# Patient Record
Sex: Female | Born: 1990 | Race: Black or African American | Hispanic: No | Marital: Single | Smoking: Never smoker
Health system: Southern US, Community
[De-identification: ages and names within clinical notes are randomized; demographics above are authoritative.]

## PROBLEM LIST (undated history)

## (undated) HISTORY — PX: MOUTH SURGERY: SHX715

---

## 2015-09-07 ENCOUNTER — Emergency Department (HOSPITAL_COMMUNITY): Payer: 59

## 2015-09-07 ENCOUNTER — Encounter (HOSPITAL_COMMUNITY): Payer: Self-pay

## 2015-09-07 ENCOUNTER — Emergency Department (HOSPITAL_COMMUNITY)
Admission: EM | Admit: 2015-09-07 | Discharge: 2015-09-07 | Disposition: A | Discharge status: Home / Self Care | Payer: 59 | Attending: Emergency Medicine | Admitting: Emergency Medicine

## 2015-09-07 DIAGNOSIS — Z3202 Encounter for pregnancy test, result negative: Secondary | ICD-10-CM | POA: Diagnosis not present

## 2015-09-07 DIAGNOSIS — R0981 Nasal congestion: Secondary | ICD-10-CM | POA: Insufficient documentation

## 2015-09-07 DIAGNOSIS — R05 Cough: Secondary | ICD-10-CM | POA: Diagnosis not present

## 2015-09-07 DIAGNOSIS — R1084 Generalized abdominal pain: Secondary | ICD-10-CM

## 2015-09-07 DIAGNOSIS — K920 Hematemesis: Secondary | ICD-10-CM | POA: Insufficient documentation

## 2015-09-07 DIAGNOSIS — Z862 Personal history of diseases of the blood and blood-forming organs and certain disorders involving the immune mechanism: Secondary | ICD-10-CM | POA: Diagnosis not present

## 2015-09-07 DIAGNOSIS — R1110 Vomiting, unspecified: Secondary | ICD-10-CM | POA: Diagnosis present

## 2015-09-07 DIAGNOSIS — J029 Acute pharyngitis, unspecified: Secondary | ICD-10-CM | POA: Insufficient documentation

## 2015-09-07 LAB — POC URINE PREG, ED: PREG TEST UR: NEGATIVE

## 2015-09-07 LAB — URINALYSIS, ROUTINE W REFLEX MICROSCOPIC
BILIRUBIN URINE: NEGATIVE
Glucose, UA: NEGATIVE mg/dL
Ketones, ur: NEGATIVE mg/dL
Leukocytes, UA: NEGATIVE
Nitrite: NEGATIVE
PROTEIN: NEGATIVE mg/dL
Specific Gravity, Urine: 1.027 (ref 1.005–1.030)
UROBILINOGEN UA: 0.2 mg/dL (ref 0.0–1.0)
pH: 6 (ref 5.0–8.0)

## 2015-09-07 LAB — CBC WITH DIFFERENTIAL/PLATELET
Basophils Absolute: 0 10*3/uL (ref 0.0–0.1)
Basophils Relative: 0 %
EOS PCT: 4 %
Eosinophils Absolute: 0.2 10*3/uL (ref 0.0–0.7)
HCT: 33.2 % — ABNORMAL LOW (ref 36.0–46.0)
Hemoglobin: 10.6 g/dL — ABNORMAL LOW (ref 12.0–15.0)
LYMPHS ABS: 2.6 10*3/uL (ref 0.7–4.0)
LYMPHS PCT: 47 %
MCH: 29.2 pg (ref 26.0–34.0)
MCHC: 31.9 g/dL (ref 30.0–36.0)
MCV: 91.5 fL (ref 78.0–100.0)
MONO ABS: 0.5 10*3/uL (ref 0.1–1.0)
MONOS PCT: 9 %
Neutro Abs: 2.2 10*3/uL (ref 1.7–7.7)
Neutrophils Relative %: 40 %
PLATELETS: 230 10*3/uL (ref 150–400)
RBC: 3.63 MIL/uL — AB (ref 3.87–5.11)
RDW: 14.6 % (ref 11.5–15.5)
WBC: 5.5 10*3/uL (ref 4.0–10.5)

## 2015-09-07 LAB — URINE MICROSCOPIC-ADD ON

## 2015-09-07 LAB — LIPASE, BLOOD: Lipase: 21 U/L (ref 11–51)

## 2015-09-07 LAB — COMPREHENSIVE METABOLIC PANEL
ALT: 13 U/L — AB (ref 14–54)
AST: 21 U/L (ref 15–41)
Albumin: 3.2 g/dL — ABNORMAL LOW (ref 3.5–5.0)
Alkaline Phosphatase: 47 U/L (ref 38–126)
Anion gap: 9 (ref 5–15)
BUN: 14 mg/dL (ref 6–20)
CALCIUM: 8.9 mg/dL (ref 8.9–10.3)
CHLORIDE: 109 mmol/L (ref 101–111)
CO2: 23 mmol/L (ref 22–32)
Creatinine, Ser: 1.05 mg/dL — ABNORMAL HIGH (ref 0.44–1.00)
Glucose, Bld: 86 mg/dL (ref 65–99)
Potassium: 4 mmol/L (ref 3.5–5.1)
Sodium: 141 mmol/L (ref 135–145)
TOTAL PROTEIN: 6.4 g/dL — AB (ref 6.5–8.1)
Total Bilirubin: 1 mg/dL (ref 0.3–1.2)

## 2015-09-07 MED ORDER — DICYCLOMINE HCL 20 MG PO TABS: 20.0000 mg | ORAL_TABLET | Freq: Two times a day (BID) | ORAL | Status: AC

## 2015-09-07 MED ORDER — SUCRALFATE 1 GM/10ML PO SUSP: 1.0000 g | Freq: Three times a day (TID) | ORAL | Status: AC

## 2015-09-07 NOTE — ED Notes (Addendum)
Per Pt, Patient reports having vomiting with blood and mucous present for the past week. Pt reports abdominal pain. Reports having cough, cold-like symptoms, body aches, and a sore throat for three weeks. Denies pain with eating specifically and diarrhea. Patient is alert and oriented x4.

## 2015-09-07 NOTE — Discharge Instructions (Signed)
Abdominal Pain, Adult Many things can cause abdominal pain. Usually, abdominal pain is not caused by a disease and will improve without treatment. It can often be observed and treated at home. Your health care provider will do a physical exam and possibly order blood tests and X-rays to help determine the seriousness of your pain. However, in many cases, more time must pass before a clear cause of the pain can be found. Before that point, your health care provider may not know if you need more testing or further treatment. HOME CARE INSTRUCTIONS Monitor your abdominal pain for any changes. The following actions may help to alleviate any discomfort you are experiencing:  Only take over-the-counter or prescription medicines as directed by your health care provider.  Do not take laxatives unless directed to do so by your health care provider.  Try a clear liquid diet (broth, tea, or water) as directed by your health care provider. Slowly move to a bland diet as tolerated. SEEK MEDICAL CARE IF:  You have unexplained abdominal pain.  You have abdominal pain associated with nausea or diarrhea.  You have pain when you urinate or have a bowel movement.  You experience abdominal pain that wakes you in the night.  You have abdominal pain that is worsened or improved by eating food.  You have abdominal pain that is worsened with eating fatty foods.  You have a fever. SEEK IMMEDIATE MEDICAL CARE IF:  Your pain does not go away within 2 hours.  You keep throwing up (vomiting).  Your pain is felt only in portions of the abdomen, such as the right side or the left lower portion of the abdomen.  You pass bloody or black tarry stools. MAKE SURE YOU:  Understand these instructions.  Will watch your condition.  Will get help right away if you are not doing well or get worse.   This information is not intended to replace advice given to you by your health care provider. Make sure you discuss  any questions you have with your health care provider.   Document Released: 08/03/2005 Document Revised: 07/15/2015 Document Reviewed: 07/03/2013 Elsevier Interactive Patient Education 2016 [employer]  Hematemesis Hematemesis is when you vomit blood. It is a sign of bleeding in the upper part of your digestive tract. This is also called your gastrointestinal (GI) tract. Your upper GI tract includes your mouth, throat, esophagus, stomach, and the first part of your small intestine (duodenum).  Hematemesis is usually caused by bleeding from your esophagus or stomach. You may suddenly vomit bright red blood. You might also vomit old blood. It may look like coffee grounds. You may also have other symptoms, such as:  Stomach pain.  Heartburn.  Black and tarry stool.  HOME CARE INSTRUCTIONS  Watch your hematemesis for any changes. The following actions may help to lessen any discomfort you are feeling:  Take medicines only as directed by your health care provider. Do not take aspirin, ibuprofen, or any other anti-inflammatory medicine without approval from your health care provider.  Rest as needed.  Drink small sips of clear liquids often, as long as you can keep them down. Try to drink enough fluids to keep your urine clear or pale yellow.  Do not drink alcohol.  Do not use any tobacco products, including cigarettes, chewing tobacco, or electronic cigarettes. If you need help quitting, ask your health care provider.  Keep all follow-up visits as directed by your health care provider. This is important. SEEK MEDICAL CARE  IF:   The vomiting of blood worsens, or begins again after it has stopped.  You have persistent stomach pain.  You have nausea, indigestion, or heartburn.  You feel weak or dizzy. SEEK IMMEDIATE MEDICAL CARE IF:   You faint or feel extremely weak.  You have a rapid heartbeat.  You are urinating less than normal or not at all.  You have persistent  vomiting.  You vomit large amounts of bloody or dark material.  You vomit bright red blood.  You pass large, dark, or bloody stools.  You have chest pain or trouble breathing.   This information is not intended to replace advice given to you by your health care provider. Make sure you discuss any questions you have with your health care provider.   Document Released: 12/01/2004 Document Revised: 11/14/2014 Document Reviewed: 06/18/2014 Elsevier Interactive Patient Education [employer]

## 2015-09-07 NOTE — ED Notes (Signed)
Phlebotomy at the bedside  

## 2015-09-07 NOTE — ED Provider Notes (Signed)
CSN: [id number]     Arrival date & time 09/07/15  4098 History   First MD Initiated Contact with Patient 09/07/15 0701     Chief Complaint  Patient presents with  . Emesis     (Consider location/radiation/quality/duration/timing/severity/associated sxs/prior Treatment) HPI Comments: Patient presents to the emergency department for evaluation of vomiting. Patient reports that she has been sick for 3 weeks with upper respiratory infection symptoms. She has had sinus congestion, sore throat, cough. A week ago, however, she vomited blood. She saw her doctor and was referred to a GI specialist. Her doctor or a prescription for PPI. Patient reports that she started to feel better, did not start the medicine and canceled her GI appointment. She has now had several more episodes of vomiting small amounts of blood. She also has been experiencing abdominal pain which is intermittent and generalized.  Patient is a 24 y.o. female presenting with vomiting.  Emesis Associated symptoms: abdominal pain and sore throat     No past medical history on file. No past surgical history on file. No family history on file. Social History  Substance Use Topics  . Smoking status: Not on file  . Smokeless tobacco: Not on file  . Alcohol Use: Not on file   OB History    No data available     Review of Systems  HENT: Positive for congestion and sore throat.   Respiratory: Positive for cough.   Gastrointestinal: Positive for nausea, vomiting and abdominal pain.  All other systems reviewed and are negative.     Allergies  Review of patient's allergies indicates not on file.  Home Medications   Prior to Admission medications   Not on File   BP 139/92 mmHg  Pulse 87  Temp(Src) 98.9 F (37.2 C) (Oral)  Resp 16  SpO2 100% Physical Exam  Constitutional: She is oriented to person, place, and time. She appears well-developed and well-nourished. No distress.  HENT:  Head: Normocephalic and  atraumatic.  Right Ear: Hearing normal.  Left Ear: Hearing normal.  Nose: Nose normal.  Mouth/Throat: Oropharynx is clear and moist and mucous membranes are normal.  Eyes: Conjunctivae and EOM are normal. Pupils are equal, round, and reactive to light.  Neck: Normal range of motion. Neck supple.  Cardiovascular: Regular rhythm, S1 normal and S2 normal.  Exam reveals no gallop and no friction rub.   No murmur heard. Pulmonary/Chest: Effort normal and breath sounds normal. No respiratory distress. She exhibits no tenderness.  Abdominal: Soft. Normal appearance and bowel sounds are normal. There is no hepatosplenomegaly. There is no tenderness. There is no rebound, no guarding, no tenderness at McBurney's point and negative Murphy's sign. No hernia.  Musculoskeletal: Normal range of motion.  Neurological: She is alert and oriented to person, place, and time. She has normal strength. No cranial nerve deficit or sensory deficit. Coordination normal. GCS eye subscore is 4. GCS verbal subscore is 5. GCS motor subscore is 6.  Skin: Skin is warm, dry and intact. No rash noted. No cyanosis.  Psychiatric: She has a normal mood and affect. Her speech is normal and behavior is normal. Thought content normal.  Nursing note and vitals reviewed.   ED Course  Procedures (including critical care time) Labs Review Labs Reviewed - No data to display  Imaging Review No results found. I have personally reviewed and evaluated these images and lab results as part of my medical decision-making.   EKG Interpretation None      MDM  Final diagnoses:  None   hematemesis  URI  Patient has had several episodes of vomiting that has been mixed with blood. She was scheduled for gastroenterology follow-up and prescribed a PPI by her doctor, but she did not fill the prescription or go to the appointment. She appears well today. Vital signs are stable. Abdominal and chest x-ray were negative. Blood work reveals  mild anemia, but she reports a history of chronic anemia. She does not require transfusion at this time. Remainder of her workup was unremarkable. Recommend initiating PPI, will add Carafate. Follow-up with gastroenterology.    Gilda Creasehristopher J Goodwin [place], MD 09/07/15 [phone]

## 2017-03-25 IMAGING — CR DG ABDOMEN ACUTE W/ 1V CHEST
3 series · 3 of 3 positions shown · non-contrast
Comparison: None.

CLINICAL DATA: 24-year-old female with nausea, vomiting and mid
abdominal pain for the past 1.5 weeks as well as upper respiratory
symptoms for the past 3 weeks.

EXAM:
DG ABDOMEN ACUTE W/ 1V CHEST

[chest pa]
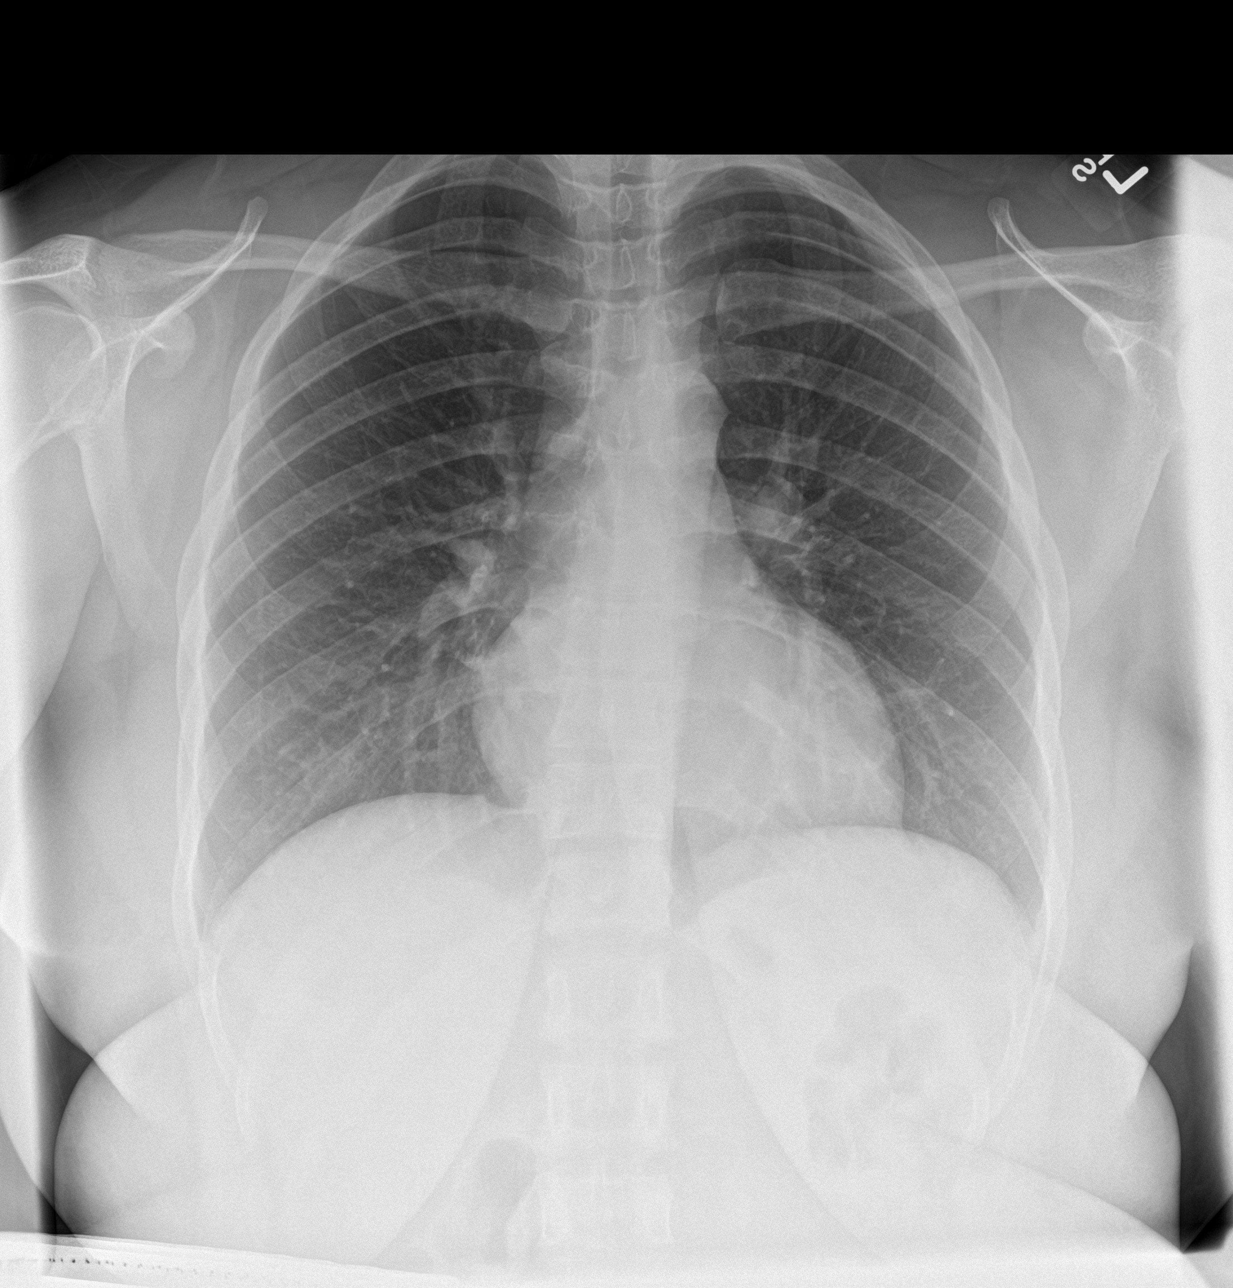

[abdomen erect]
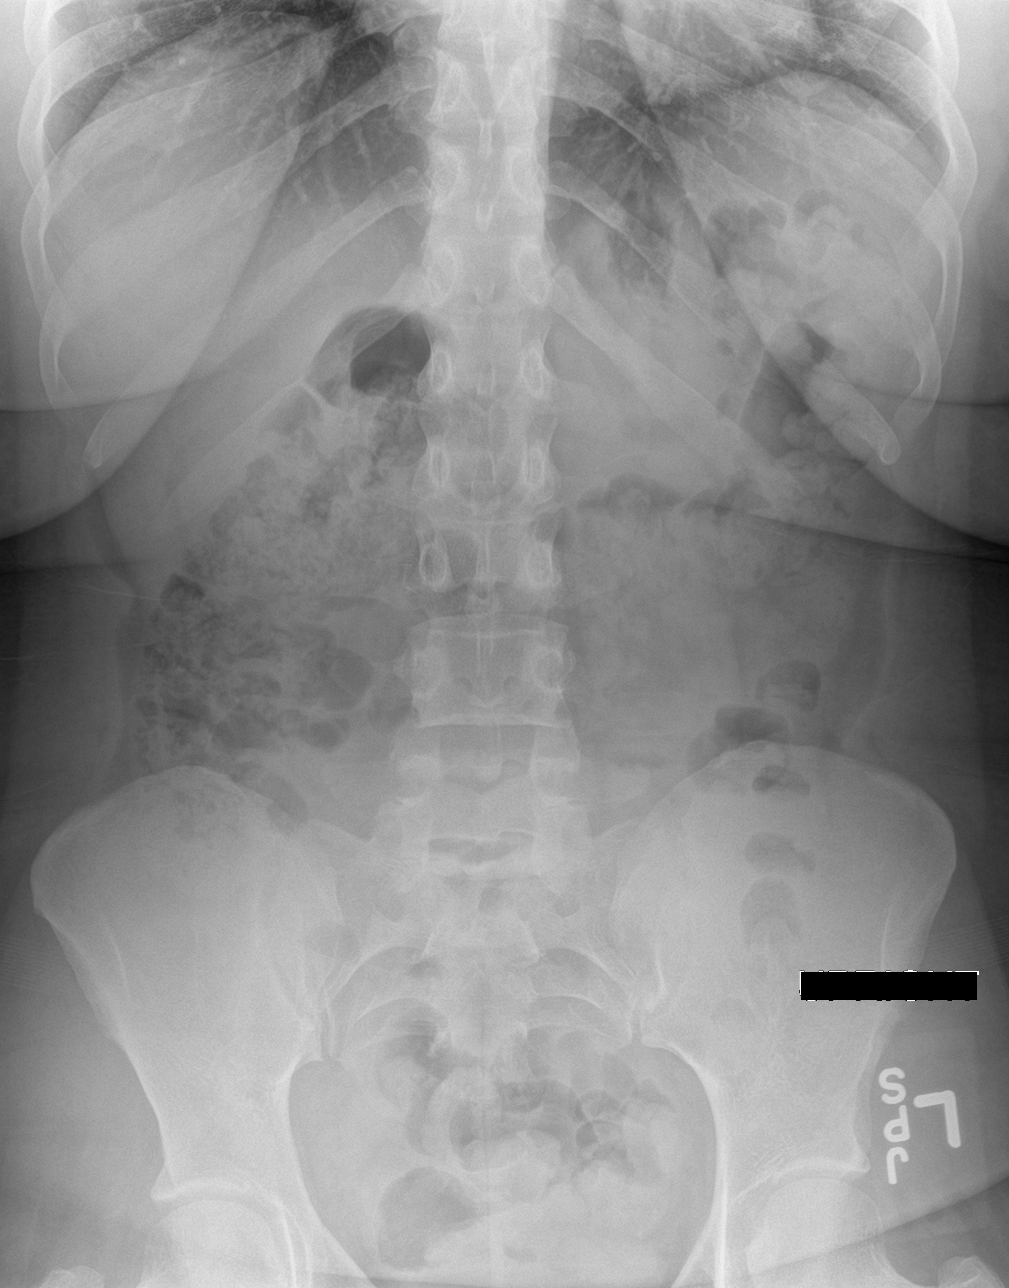

[abdomen supine]
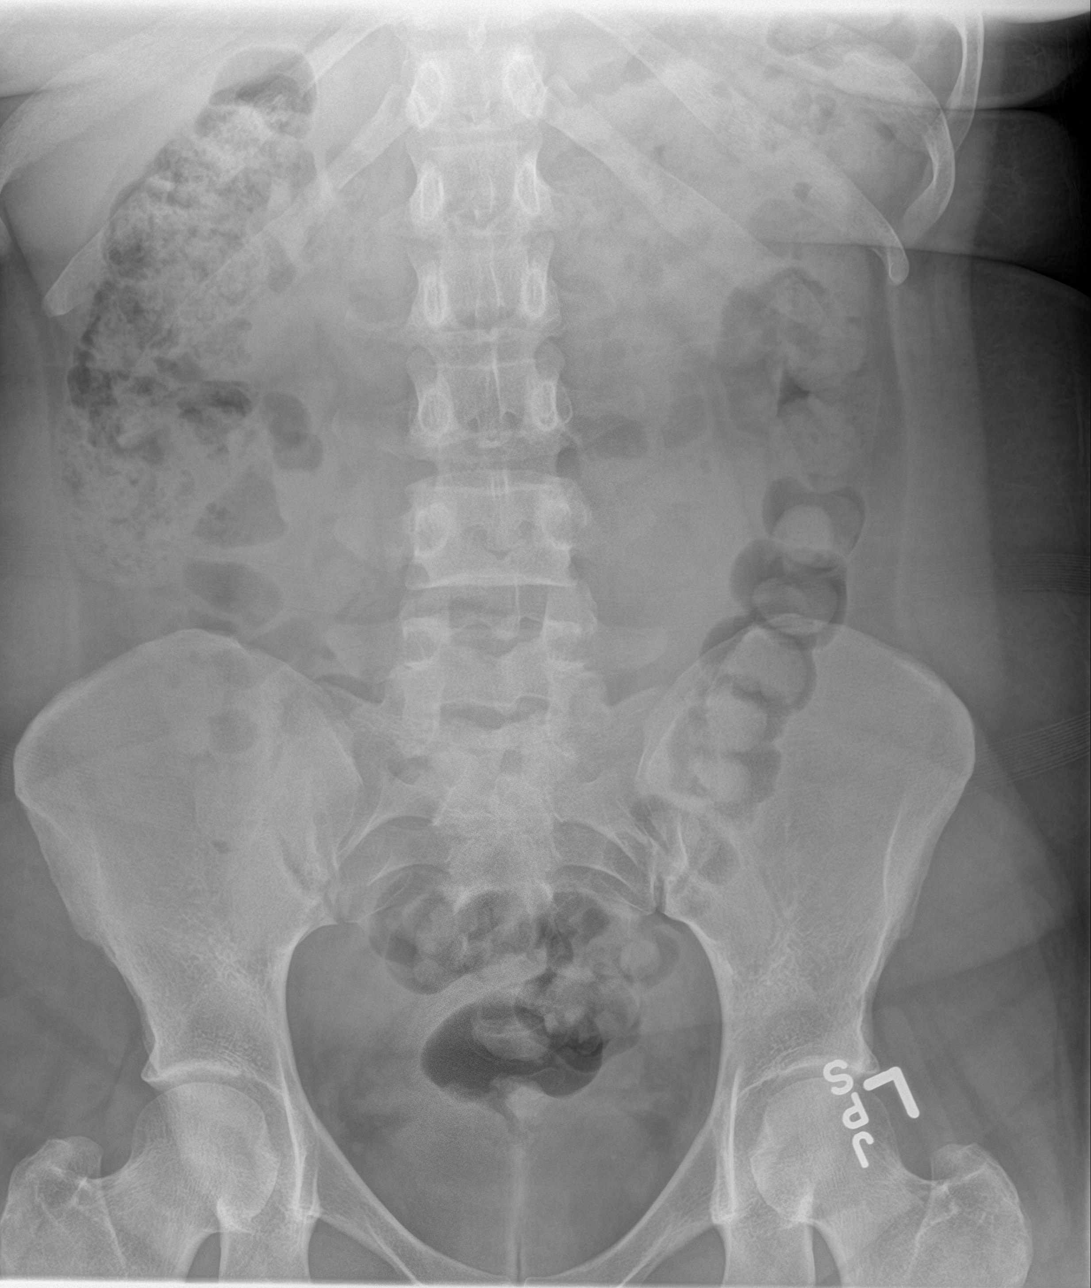

[3 of 3 positions shown; findings below may reference images not displayed]

FINDINGS: There is no evidence of dilated bowel loops or free intraperitoneal
air. No radiopaque calculi or other significant radiographic
abnormality is seen. Heart size and mediastinal contours are within
normal limits. Both lungs are clear.
IMPRESSION: Negative abdominal radiographs.  No acute cardiopulmonary disease.

## 2017-06-19 ENCOUNTER — Other Ambulatory Visit (HOSPITAL_COMMUNITY)
Admission: RE | Admit: 2017-06-19 | Discharge: 2017-06-19 | Disposition: A | Discharge status: Home / Self Care | Payer: 59 | Source: Ambulatory Visit | Attending: Family Medicine | Admitting: Family Medicine

## 2017-06-19 ENCOUNTER — Other Ambulatory Visit: Payer: Self-pay | Admitting: Physician Assistant

## 2017-06-19 DIAGNOSIS — Z01419 Encounter for gynecological examination (general) (routine) without abnormal findings: Secondary | ICD-10-CM | POA: Insufficient documentation

## 2017-06-20 LAB — CYTOLOGY - PAP: DIAGNOSIS: NEGATIVE
